# Patient Record
Sex: Female | Born: 1995 | Race: White | Hispanic: No | Marital: Single | Smoking: Never smoker
Health system: Southern US, Community
[De-identification: ages and names within clinical notes are randomized; demographics above are authoritative.]

## PROBLEM LIST (undated history)

## (undated) DIAGNOSIS — F329 Major depressive disorder, single episode, unspecified: Secondary | ICD-10-CM

## (undated) DIAGNOSIS — F32A Depression, unspecified: Secondary | ICD-10-CM

## (undated) DIAGNOSIS — F419 Anxiety disorder, unspecified: Secondary | ICD-10-CM

## (undated) DIAGNOSIS — M545 Low back pain, unspecified: Secondary | ICD-10-CM

---
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
## 2016-01-31 ENCOUNTER — Encounter: Payer: Self-pay | Admitting: Emergency Medicine

## 2016-01-31 ENCOUNTER — Emergency Department: Payer: BLUE CROSS/BLUE SHIELD

## 2016-01-31 ENCOUNTER — Emergency Department
Admission: EM | Admit: 2016-01-31 | Discharge: 2016-01-31 | Disposition: A | Discharge status: Home / Self Care | Payer: BLUE CROSS/BLUE SHIELD | Attending: Emergency Medicine | Admitting: Emergency Medicine

## 2016-01-31 DIAGNOSIS — Y92410 Unspecified street and highway as the place of occurrence of the external cause: Secondary | ICD-10-CM | POA: Insufficient documentation

## 2016-01-31 DIAGNOSIS — Y9389 Activity, other specified: Secondary | ICD-10-CM | POA: Diagnosis not present

## 2016-01-31 DIAGNOSIS — S161XXA Strain of muscle, fascia and tendon at neck level, initial encounter: Secondary | ICD-10-CM | POA: Insufficient documentation

## 2016-01-31 DIAGNOSIS — Y999 Unspecified external cause status: Secondary | ICD-10-CM | POA: Diagnosis not present

## 2016-01-31 DIAGNOSIS — M7918 Myalgia, other site: Secondary | ICD-10-CM

## 2016-01-31 DIAGNOSIS — S199XXA Unspecified injury of neck, initial encounter: Secondary | ICD-10-CM | POA: Diagnosis present

## 2016-01-31 HISTORY — DX: Major depressive disorder, single episode, unspecified: F32.9

## 2016-01-31 HISTORY — DX: Depression, unspecified: F32.A

## 2016-01-31 HISTORY — DX: Anxiety disorder, unspecified: F41.9

## 2016-01-31 LAB — POCT PREGNANCY, URINE: Preg Test, Ur: NEGATIVE

## 2016-01-31 MED ORDER — CYCLOBENZAPRINE HCL 10 MG PO TABS
5.0000 mg | ORAL_TABLET | Freq: Once | ORAL | Status: AC
  Administered 2016-01-31: 5 mg via ORAL
  Filled 2016-01-31: qty 1

## 2016-01-31 MED ORDER — CYCLOBENZAPRINE HCL 5 MG PO TABS: 5.0000 mg | ORAL_TABLET | Freq: Three times a day (TID) | ORAL | 0 refills | Status: AC | PRN

## 2016-01-31 MED ORDER — NAPROXEN 500 MG PO TBEC: 500.0000 mg | DELAYED_RELEASE_TABLET | Freq: Two times a day (BID) | ORAL | 0 refills | Status: AC

## 2016-01-31 MED ORDER — NAPROXEN 500 MG PO TABS
500.0000 mg | ORAL_TABLET | Freq: Once | ORAL | Status: AC
  Administered 2016-01-31: 500 mg via ORAL
  Filled 2016-01-31: qty 1
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
## 2016-01-31 NOTE — ED Notes (Signed)
c-collar applied in triage
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
